# Patient Record
Sex: Female | Born: 1985 | Race: White | Hispanic: No | Marital: Single
Health system: Southern US, Community
[De-identification: ages and names within clinical notes are randomized; demographics above are authoritative.]

---

## 2011-07-29 ENCOUNTER — Ambulatory Visit: Payer: Self-pay | Admitting: Internal Medicine

## 2014-02-28 ENCOUNTER — Ambulatory Visit: Payer: Self-pay | Admitting: Family Medicine

## 2014-03-22 ENCOUNTER — Emergency Department: Payer: Self-pay | Admitting: Emergency Medicine

## 2014-03-22 LAB — CBC WITH DIFFERENTIAL/PLATELET
BASOS ABS: 0.1 10*3/uL (ref 0.0–0.1)
BASOS PCT: 0.5 %
EOS PCT: 0.8 %
Eosinophil #: 0.1 10*3/uL (ref 0.0–0.7)
HCT: 42.7 % (ref 35.0–47.0)
HGB: 13.9 g/dL (ref 12.0–16.0)
LYMPHS ABS: 4.6 10*3/uL — AB (ref 1.0–3.6)
Lymphocyte %: 30.7 %
MCH: 28.7 pg (ref 26.0–34.0)
MCHC: 32.6 g/dL (ref 32.0–36.0)
MCV: 88 fL (ref 80–100)
Monocyte #: 1.2 x10 3/mm — ABNORMAL HIGH (ref 0.2–0.9)
Monocyte %: 7.8 %
NEUTROS ABS: 9 10*3/uL — AB (ref 1.4–6.5)
NEUTROS PCT: 60.2 %
Platelet: 348 10*3/uL (ref 150–440)
RBC: 4.86 10*6/uL (ref 3.80–5.20)
RDW: 13 % (ref 11.5–14.5)
WBC: 14.9 10*3/uL — AB (ref 3.6–11.0)

## 2014-03-22 LAB — COMPREHENSIVE METABOLIC PANEL
ALK PHOS: 76 U/L
AST: 10 U/L — AB (ref 15–37)
Albumin: 3.7 g/dL (ref 3.4–5.0)
Anion Gap: 9 (ref 7–16)
BILIRUBIN TOTAL: 0.2 mg/dL (ref 0.2–1.0)
BUN: 13 mg/dL (ref 7–18)
CREATININE: 1.01 mg/dL (ref 0.60–1.30)
Calcium, Total: 10.2 mg/dL — ABNORMAL HIGH (ref 8.5–10.1)
Chloride: 108 mmol/L — ABNORMAL HIGH (ref 98–107)
Co2: 24 mmol/L (ref 21–32)
EGFR (African American): >60
GLUCOSE: 162 mg/dL — AB (ref 65–99)
Osmolality: 285 (ref 275–301)
Potassium: 3.7 mmol/L (ref 3.5–5.1)
SGPT (ALT): 17 U/L (ref 12–78)
SODIUM: 141 mmol/L (ref 136–145)
TOTAL PROTEIN: 7.6 g/dL (ref 6.4–8.2)

## 2014-03-22 LAB — LIPASE, BLOOD: Lipase: 117 U/L (ref 73–393)

## 2014-03-23 LAB — URINALYSIS, COMPLETE
BILIRUBIN, UR: NEGATIVE
Glucose,UR: NEGATIVE mg/dL (ref 0–75)
LEUKOCYTE ESTERASE: NEGATIVE
NITRITE: NEGATIVE
Ph: 5 (ref 4.5–8.0)
Specific Gravity: 1.032 (ref 1.003–1.030)

## 2015-02-28 ENCOUNTER — Emergency Department: Payer: Self-pay | Admitting: Emergency Medicine

## 2015-07-09 LAB — URINALYSIS, COMPLETE
BILIRUBIN, UR: NEGATIVE
Blood: NEGATIVE
Glucose,UR: NEGATIVE mg/dL (ref 0–75)
Leukocyte Esterase: NEGATIVE
Nitrite: NEGATIVE
PH: 6 (ref 4.5–8.0)
PROTEIN: NEGATIVE
SPECIFIC GRAVITY: 1.013 (ref 1.003–1.030)

## 2015-07-09 LAB — COMPREHENSIVE METABOLIC PANEL
ALK PHOS: 69 U/L
ANION GAP: 11 (ref 7–16)
AST: 21 U/L
Albumin: 3.9 g/dL
BILIRUBIN TOTAL: 0.5 mg/dL
BUN: 9 mg/dL
CALCIUM: 9.2 mg/dL
Chloride: 103 mmol/L
Co2: 25 mmol/L
Creatinine: 0.8 mg/dL
EGFR (African American): >60
Glucose: 108 mg/dL — ABNORMAL HIGH
POTASSIUM: 3.5 mmol/L
SGPT (ALT): 20 U/L
SODIUM: 139 mmol/L
Total Protein: 7.4 g/dL

## 2015-07-09 LAB — CBC
HCT: 46.7 % (ref 35.0–47.0)
HGB: 15.3 g/dL (ref 12.0–16.0)
MCH: 28.3 pg (ref 26.0–34.0)
MCHC: 32.8 g/dL (ref 32.0–36.0)
MCV: 86 fL (ref 80–100)
Platelet: 296 10*3/uL (ref 150–440)
RBC: 5.41 10*6/uL — ABNORMAL HIGH (ref 3.80–5.20)
RDW: 13.2 % (ref 11.5–14.5)
WBC: 11.2 10*3/uL — ABNORMAL HIGH (ref 3.6–11.0)

## 2015-07-09 LAB — LIPASE, BLOOD: Lipase: 19 U/L — ABNORMAL LOW

## 2016-10-05 IMAGING — CT CT ABD-PELV W/ CM
2 of 4 series · 16 of 46 positions shown, 18 images · IV contrast (omnipaque)
Comparison: 03/22/2014

CLINICAL DATA: Generalized abdominal pain and nausea for 1 week

EXAM:
CT ABDOMEN AND PELVIS WITH CONTRAST
TECHNIQUE: Multidetector CT imaging of the abdomen and pelvis was performed
using the standard protocol following bolus administration of
intravenous contrast.
CONTRAST:  100 cc Omnipaque

[Series 2: routine abd pel with · axial · 0.84mm/px · z∈[+133,+558]mm · 13 of 95 slices shown, 15 images]
[im 5/95  soft-tissue]
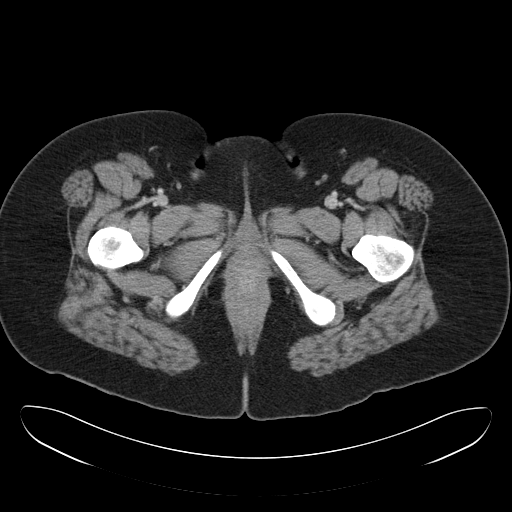
[im 5/95  bone]
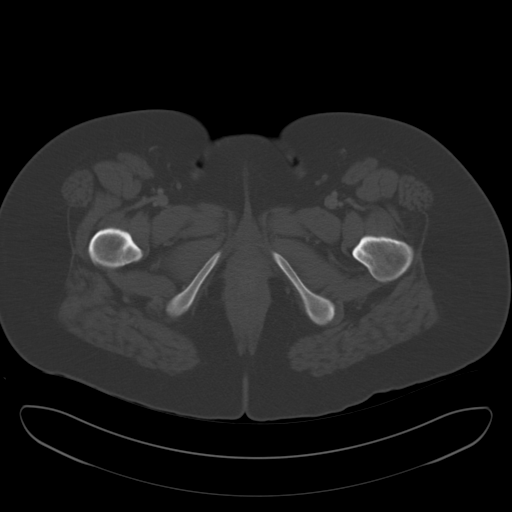
[im 13/95  soft-tissue]
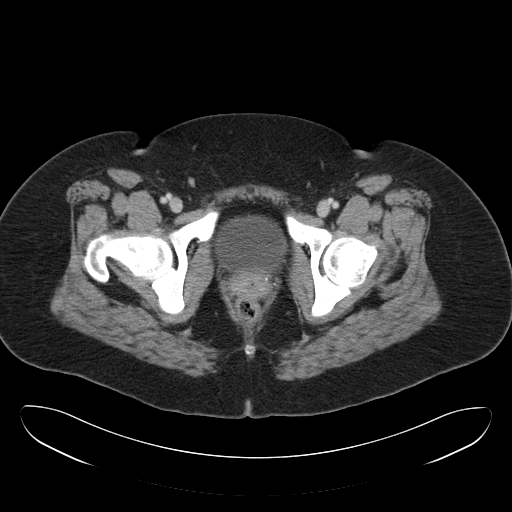
[im 21/95  soft-tissue]
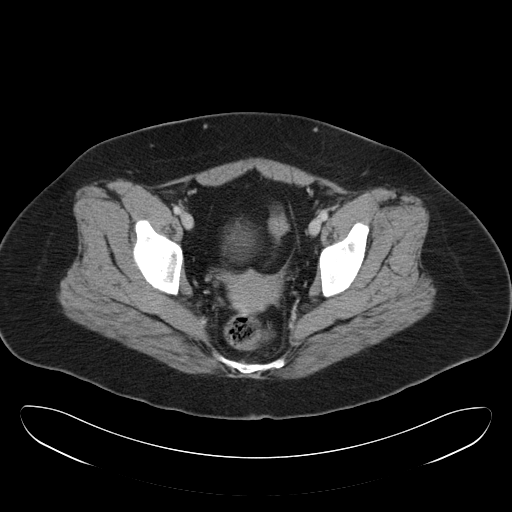
[im 25/95  soft-tissue]
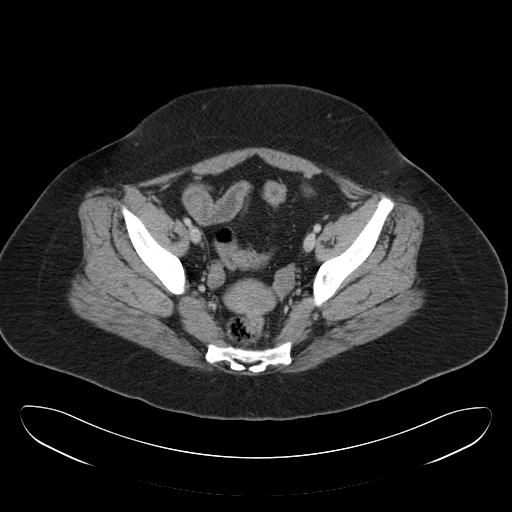
[im 33/95  soft-tissue]
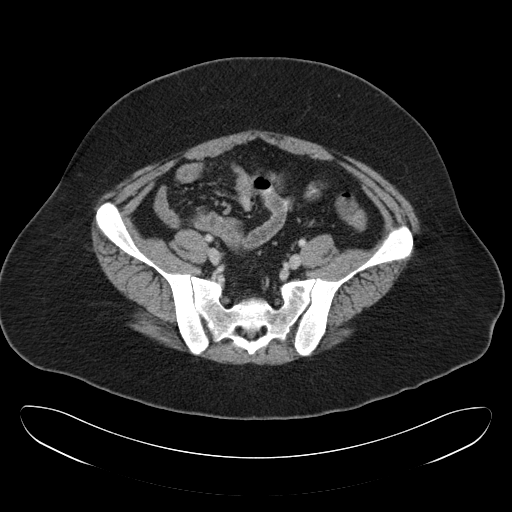
[im 41/95  soft-tissue]
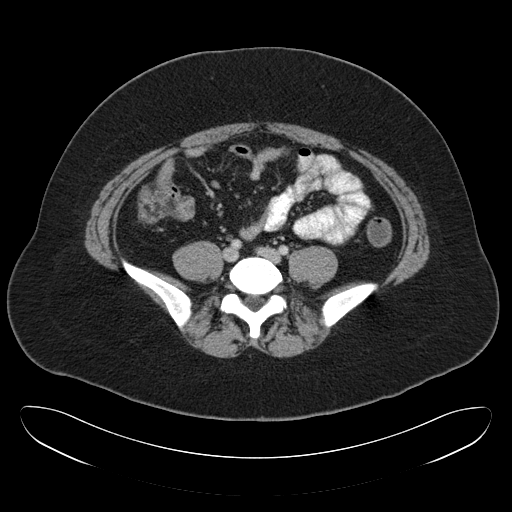
[im 50/95  soft-tissue]
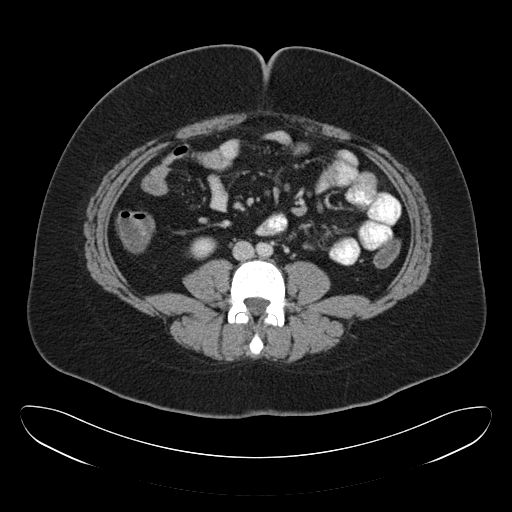
[im 54/95  soft-tissue]
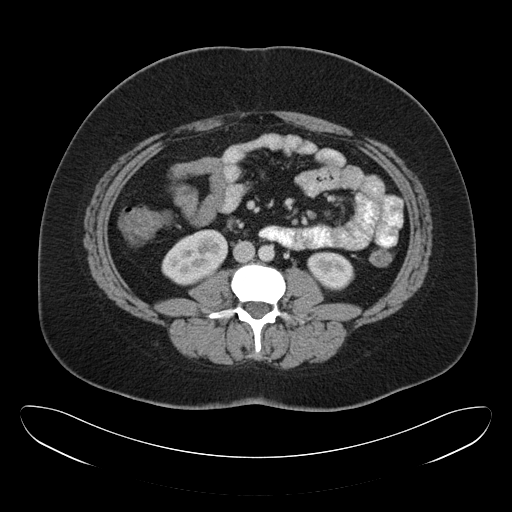
[im 62/95  soft-tissue]
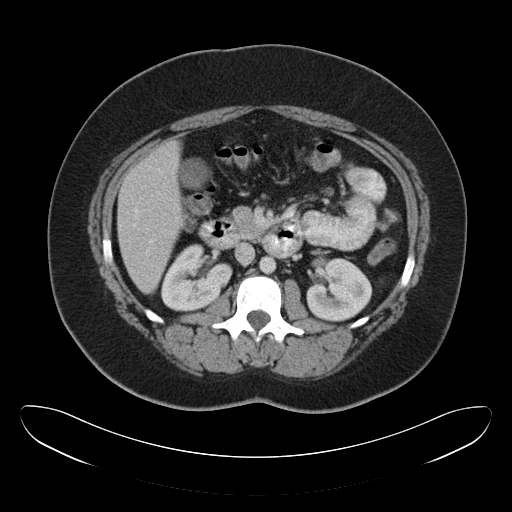
[im 62/95  bone]
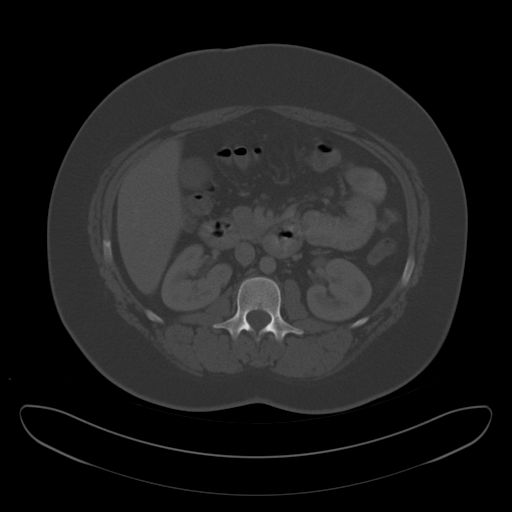
[im 70/95  soft-tissue]
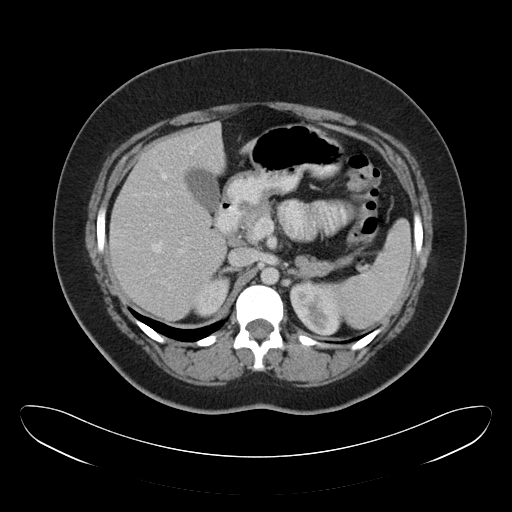
[im 74/95  soft-tissue]
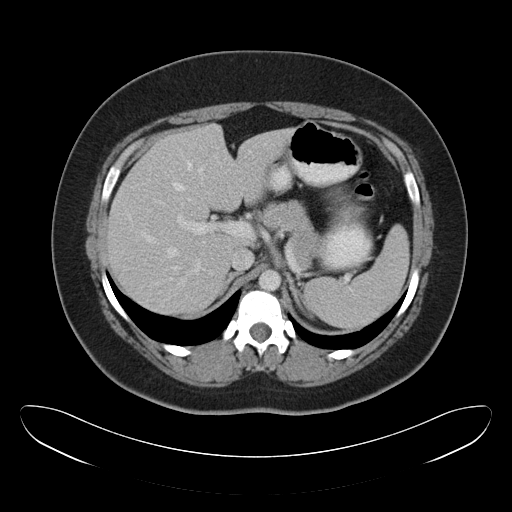
[im 82/95  soft-tissue]
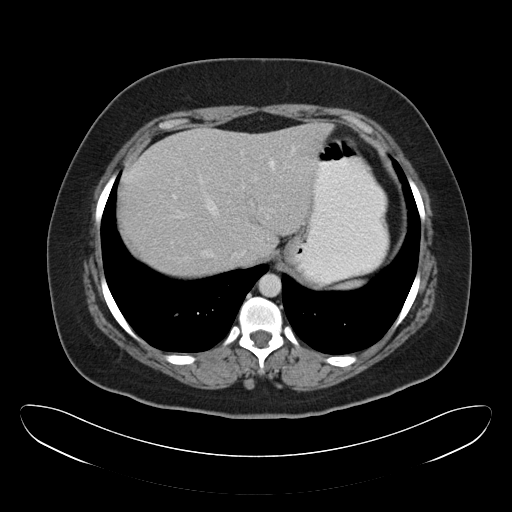
[im 90/95  soft-tissue]
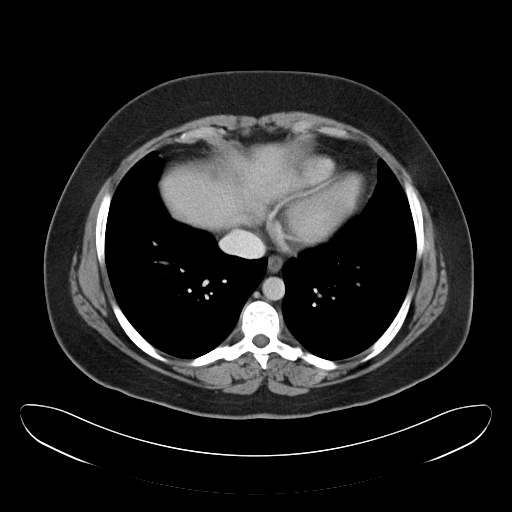

[Series 5: cor routine abd pel with · coronal · 0.80mm/px · 3 of 152 slices shown]
[im 51/152  soft-tissue]
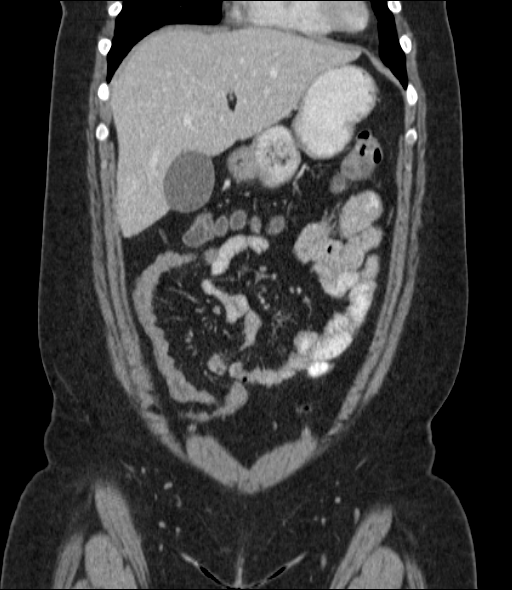
[im 68/152  soft-tissue]
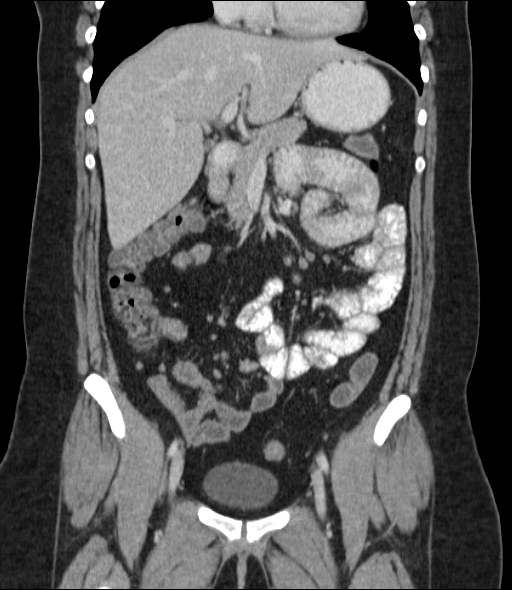
[im 84/152  soft-tissue]
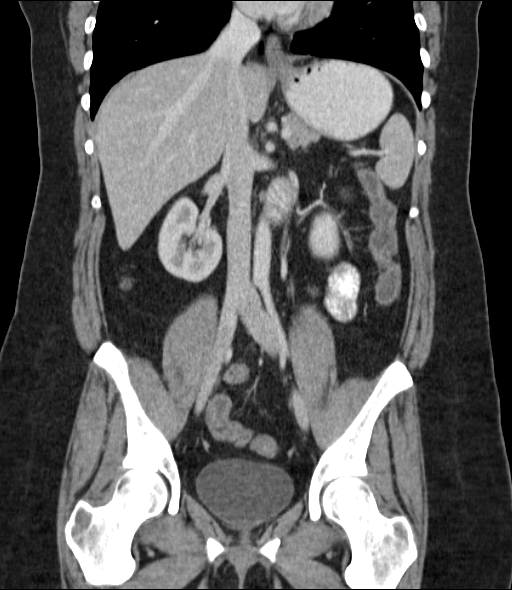

[16 of 46 positions shown; findings below may reference images not displayed]

FINDINGS: The lung bases are unremarkable. Sagittal images of the spine are
unremarkable. Enhanced liver is unremarkable. No calcified
gallstones are noted within gallbladder. Enhanced pancreas, spleen
and adrenal glands are unremarkable. Enhanced kidneys are
symmetrical in size. No hydronephrosis or hydroureter. There is no
aortic aneurysm. No small bowel obstruction. No ascites or free air.
The terminal ileum is unremarkable.

Small nonspecific lymph nodes are noted in right lower quadrant
mesentery the largest in axial image 52 measures 9 x 6 mm not
pathologic by size criteria.

Normal appendix clearly visualize in axial image 60. Mild
retroflexed uterus. No adnexal mass. The urinary bladder is
unremarkable. There is no distal colonic obstruction. No inguinal
adenopathy. No destructive bony lesions are noted within pelvis.
IMPRESSION: 1. There is no acute inflammatory process within abdomen or pelvis.
2. Normal appendix. No pericecal inflammation. Nonspecific lymph
nodes in right lower quadrant mesentery without adenopathy.
3. No adnexal mass.  Mild retroflexed uterus.
4. No hydronephrosis or hydroureter.

## 2021-09-03 ENCOUNTER — Emergency Department
Admission: EM | Admit: 2021-09-03 | Discharge: 2021-09-03 | Disposition: A | Discharge status: Home / Self Care | Payer: Self-pay | Attending: Emergency Medicine | Admitting: Emergency Medicine

## 2021-09-03 ENCOUNTER — Other Ambulatory Visit: Payer: Self-pay

## 2021-09-03 DIAGNOSIS — Z5321 Procedure and treatment not carried out due to patient leaving prior to being seen by health care provider: Secondary | ICD-10-CM | POA: Insufficient documentation

## 2021-09-03 DIAGNOSIS — N200 Calculus of kidney: Secondary | ICD-10-CM | POA: Insufficient documentation

## 2021-09-03 NOTE — ED Triage Notes (Signed)
Pt [place] into the ED via [place] side of the [place], states she was trying to make it to work and had to pull off the side of the road, states she was dx with kidney stones and today had severe right flank pain.   146/90 HR84 RR16 98%RA temp 97.4
